# Patient Record
Sex: Female | Born: 1960 | Hispanic: Yes | Marital: Married | Smoking: Never smoker
Health system: Southern US, Community
[De-identification: ages and names within clinical notes are randomized; demographics above are authoritative.]

## PROBLEM LIST (undated history)

## (undated) HISTORY — PX: WISDOM TOOTH EXTRACTION: SHX21

---

## 2009-08-08 ENCOUNTER — Ambulatory Visit: Payer: Self-pay | Admitting: Family Medicine

## 2009-08-08 IMAGING — US SCREENING ULTRASOUND OF ABDOMINAL AORTA
1 series · 11 of 11 positions shown · non-contrast
Comparison: none

REASON FOR EXAM: family hx AAA
COMMENTS:

PROCEDURE:     US  - US EXAM AAA SCREENING  - [DATE]  [DATE]
RESULT:     Sonographic evaluation of the abdominal aorta demonstrates
maximum AP dimension of 1.97 cm with a transverse dimension of 2.01 cm.
There is no aneurysm or significant stenosis. The color Doppler appearance
is unremarkable.

[Series 1: screening ultrasound of abdominal aorta · 11 of 11 slices shown]
[im 1/11]
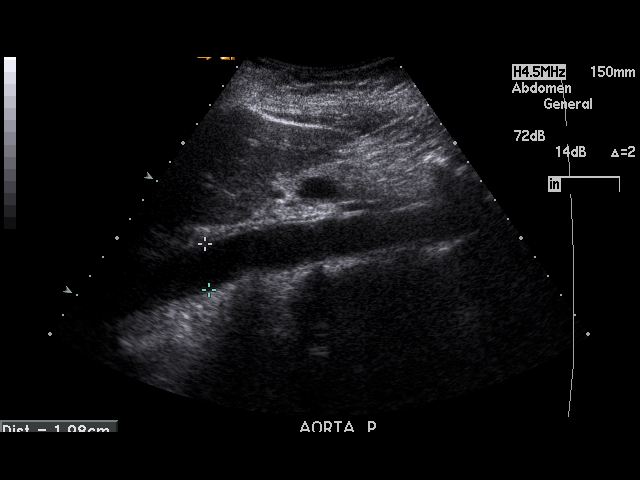
[im 2/11]
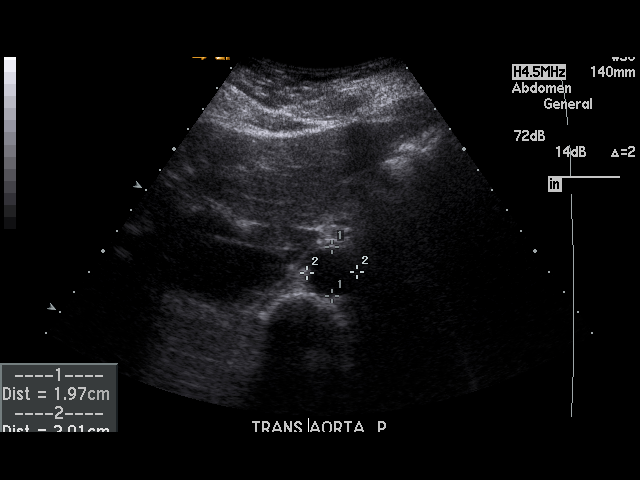
[im 3/11]
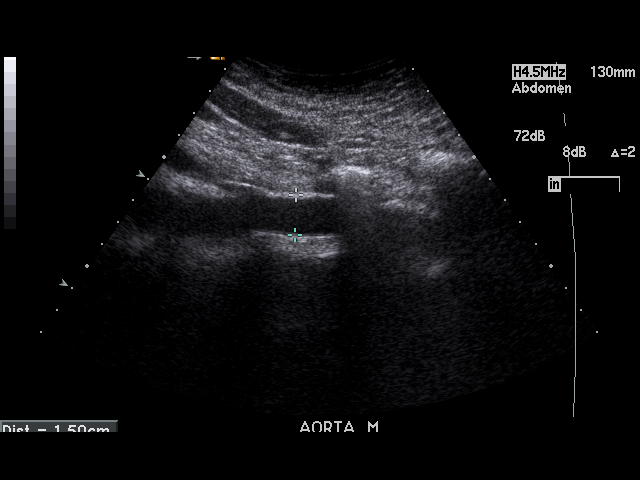
[im 4/11]
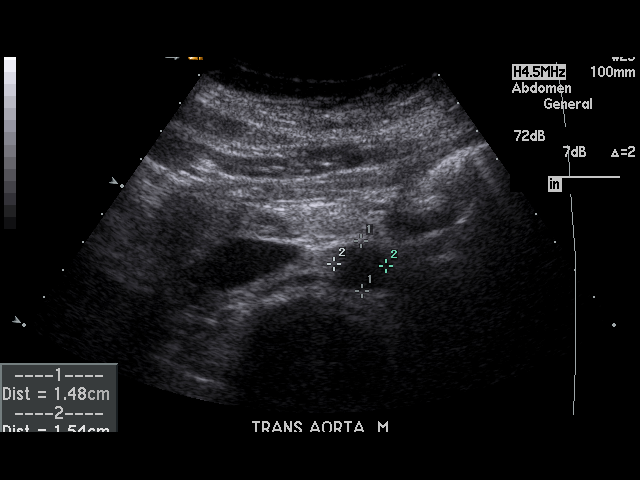
[im 5/11]
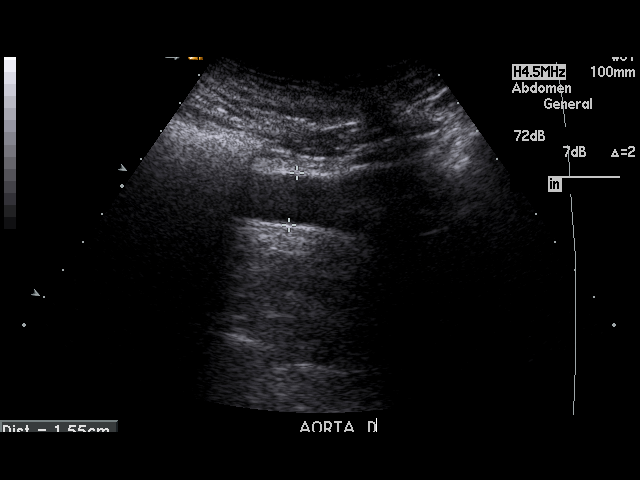
[im 6/11]
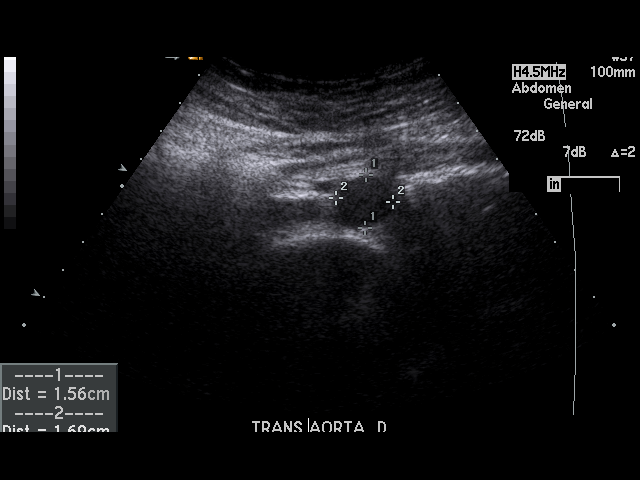
[im 7/11]
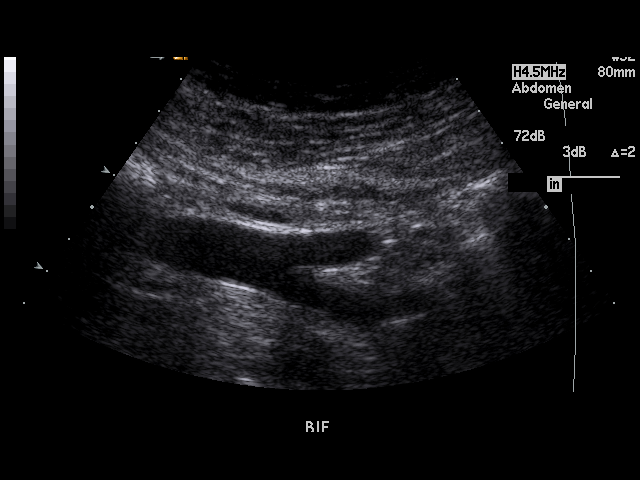
[im 8/11]
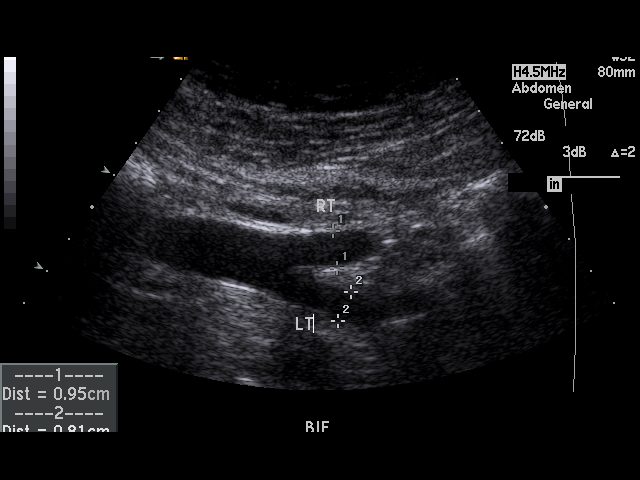
[im 9/11]
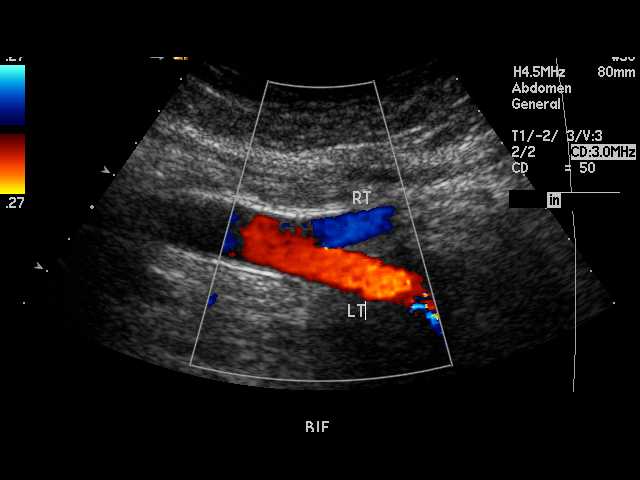
[im 10/11]
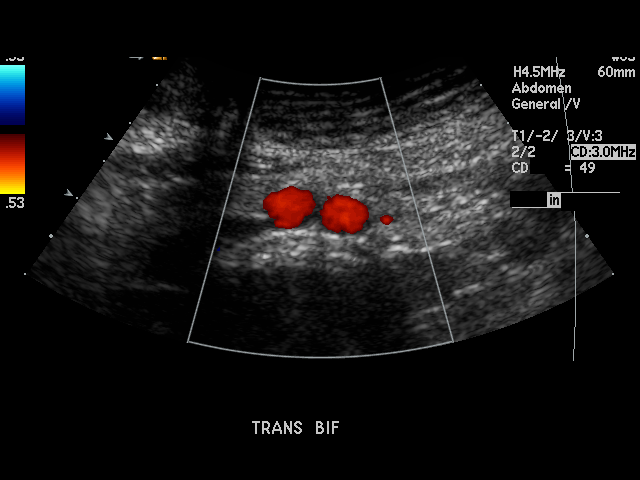
[im 11/11]
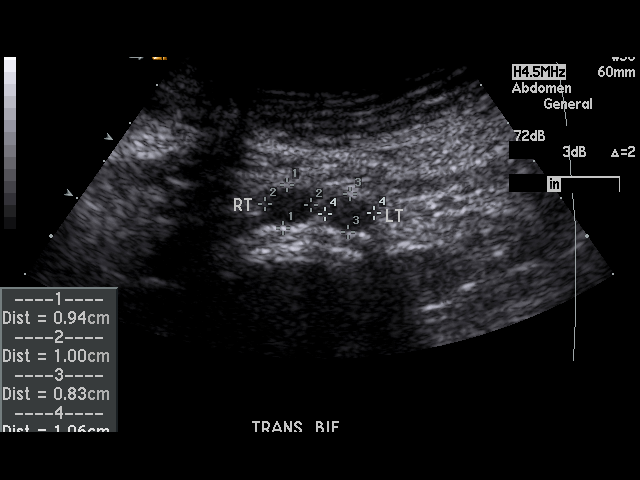

[11 of 11 positions shown; findings below may reference images not displayed]

IMPRESSION: Normal appearing aortic sonogram. The iliac show normal
caliber as well.

## 2014-12-10 ENCOUNTER — Ambulatory Visit
Admission: EM | Admit: 2014-12-10 | Discharge: 2014-12-10 | Disposition: A | Discharge status: Home / Self Care | Payer: BC Managed Care – PPO | Attending: Internal Medicine | Admitting: Internal Medicine

## 2014-12-10 ENCOUNTER — Encounter: Payer: Self-pay | Admitting: Emergency Medicine

## 2014-12-10 DIAGNOSIS — B085 Enteroviral vesicular pharyngitis: Secondary | ICD-10-CM | POA: Diagnosis not present

## 2014-12-10 DIAGNOSIS — B349 Viral infection, unspecified: Secondary | ICD-10-CM | POA: Diagnosis not present

## 2014-12-10 MED ORDER — PREDNISONE 50 MG PO TABS: 50.0000 mg | ORAL_TABLET | Freq: Every day | ORAL | Status: AC

## 2014-12-10 NOTE — Discharge Instructions (Signed)
Your mouth sores, sore throat, and ear ache seem most likely to be due to a virus, possibly herpangina. Symptoms usually improve on their own over several days. A prescription for prednisone was sent to the pharmacy, to try to help with ear pressure/congestion. Recheck for new fever >100.5, sore throat/ear ache not improving in 3-5 days.  Herpangina  Herpangina is a viral illness that causes sores inside the mouth and throat. It can be passed from person to person (contagious). Most cases of herpangina occur in the summer. CAUSES  Herpangina is caused by a virus. This virus can be spread by saliva and mouth-to-mouth contact. It can also be spread through contact with an infected person's stools. It usually takes 3 to 6 days after exposure to show signs of infection. SYMPTOMS   Fever.  Very sore, red throat.  Small blisters in the back of the throat.  Sores inside the mouth, lips, cheeks, and in the throat.  Blisters around the outside of the mouth.  Painful blisters on the palms of the hands and soles of the feet.  Irritability.  Poor appetite.  Dehydration. DIAGNOSIS  This diagnosis is made by a physical exam. Lab tests are usually not required. TREATMENT  This illness normally goes away on its own within 1 week. Medicines may be given to ease your symptoms. HOME CARE INSTRUCTIONS   Avoid salty, spicy, or acidic food and drinks. These foods may make your sores more painful.  If the patient is a baby or young child, weigh your child daily to check for dehydration. Rapid weight loss indicates there is not enough fluid intake. Consult your caregiver immediately.  Ask your caregiver for specific rehydration instructions.  Only take over-the-counter or prescription medicines for pain, discomfort, or fever as directed by your caregiver. SEEK IMMEDIATE MEDICAL CARE IF:   Your pain is not relieved with medicine.  You have signs of dehydration, such as dry lips and mouth,  dizziness, dark urine, confusion, or a rapid pulse. MAKE SURE YOU:  Understand these instructions.  Will watch your condition.  Will get help right away if you are not doing well or get worse. Document Released: 02/03/2003 Document Revised: 07/30/2011 Document Reviewed: 11/27/2010 [facility] Patient Information 2015 [place], [place]. This information is not intended to replace advice given to you by your health care provider. Make sure you discuss any questions you have with your health care provider.

## 2014-12-10 NOTE — ED Provider Notes (Addendum)
CSN: [id number]     Arrival date & time 12/10/14  1650 History   First MD Initiated Contact with Patient 12/10/14 1804     Chief Complaint  Patient presents with  . Otalgia  . URI   HPI   53yo lady with benign past medical history.  Runny/congested nose for about the last 10 days, little bit of throat-cleary type cough.Post nasal drainage.  No fever.  Sore throat increasing in last couple days, with ear ache, sores under tongue. No vomiting, no diarrhea.   History reviewed. No pertinent past medical history. Past Surgical History  Procedure Laterality Date  . Cesarean section    . Wisdom tooth extraction     Family History  Problem Relation Age of Onset  . Aneurysm Father   . Hypertension Father    History  Substance Use Topics  . Smoking status: Never Smoker   . Smokeless tobacco: Never Used  . Alcohol Use: Yes     Comment: occasional    Review of Systems  All other systems reviewed and are negative.   Allergies  Review of patient's allergies indicates no known allergies.  Home Medications  Takes Claritin and Flonase regularly  BP 136/56 mmHg  Pulse 67  Temp(Src) 98.2 F (36.8 C) (Tympanic)  Resp 16  Ht  (1.6 m)  Wt 163 lb (73.936 kg)  BMI 28.88 kg/m2  SpO2 100% Physical Exam  Constitutional: She is oriented to person, place, and time. No distress.  Alert, nicely groomed Voice sounds a little congested  HENT:  Head: Atraumatic.  R TM translucent, but flushed deep pink. L TM mod dull, no erythema. Mod to marked nasal congestion. Throat red with post nasal drainage, small erythematous patches under tongue, esp to L side.  Eyes:  Conjugate gaze, no eye redness/drainage  Neck: Neck supple.  Cardiovascular: Normal rate and regular rhythm.   Pulmonary/Chest: No respiratory distress. She has no wheezes. She has no rales.  Lungs clear, symmetric breath sounds  Abdominal: She exhibits no distension.  Musculoskeletal: Normal range of motion.  No leg  swelling  Neurological: She is alert and oriented to person, place, and time.  Skin: Skin is warm and dry. No rash noted.  No cyanosis  Nursing note and vitals reviewed.   ED Course  Procedures   None at urgent care today.  MDM   1. Viral syndrome   2. Herpangina , possible  Rx couple doses prednisone, for decongestion.   Recheck or FU pcp/Bess White NP for persistent/worsening sx's.    Eustace Moore, MD 12/10/14 2226  Eustace Moore, MD 12/11/14 Aretha Parrot  Eustace Moore, MD 12/11/14 [phone]

## 2014-12-10 NOTE — ED Notes (Signed)
Cold symptoms, ear pain for 1 week. Has taken Claritin and Flonase that was recommended from Primary doctor

## 2015-05-26 ENCOUNTER — Ambulatory Visit
Admission: EM | Admit: 2015-05-26 | Discharge: 2015-05-26 | Disposition: A | Discharge status: Home / Self Care | Payer: BC Managed Care – PPO | Attending: Family Medicine | Admitting: Family Medicine

## 2015-05-26 DIAGNOSIS — J069 Acute upper respiratory infection, unspecified: Secondary | ICD-10-CM

## 2015-05-26 MED ORDER — BENZONATATE 200 MG PO CAPS: 200.0000 mg | ORAL_CAPSULE | Freq: Three times a day (TID) | ORAL | Status: AC | PRN

## 2015-05-26 MED ORDER — HYDROCOD POLST-CPM POLST ER 10-8 MG/5ML PO SUER: 5.0000 mL | Freq: Two times a day (BID) | ORAL | Status: AC

## 2015-05-26 NOTE — ED Notes (Signed)
Non-toxic appearing pt.   Has cold like symptoms.  Dry cough, which pt reports is "sometimes productive".

## 2015-05-26 NOTE — ED Notes (Signed)
Reports chills, headache, dry cough.   Ongoing since yesterday.  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

## 2015-05-26 NOTE — Discharge Instructions (Signed)
Vaporizadores de Soil scientistaire fro Clinical research associate(Cool Mist Vaporizers) Los vaporizadores ayudan a Paramedicaliviar los sntomas de la tos y Metallurgistel resfro. Agregan humedad al aire, lo que fluidifica el moco y lo hace menos espeso. Facilitan la respiracin y favorecen la eliminacin de secreciones. Los vaporizadores de aire fro no provocan quemaduras serias Lubrizol Corporationcomo los de aire caliente, que tambin se llaman humidificadores. No se ha probado que los vaporizadores mejoren el resfro. No debe usar un vaporizador si es Pharmacologistalrgico al moho. INSTRUCCIONES PARA EL CUIDADO EN EL HOGAR  Siga las instrucciones para el uso del vaporizador que se encuentran en la caja.  Use solamente agua destilada en el vaporizador.  No use el vaporizador en forma continua. Esto puede formar moho o hacer que se desarrollen bacterias en el vaporizador.  Limpie el vaporizador cada vez que se use.  Lmpielo y squelo bien antes de guardarlo.  Deje de usarlo si los sntomas respiratorios empeoran.   Esta informacin no tiene Theme park managercomo fin reemplazar el consejo del mdico. Asegrese de hacerle al mdico cualquier pregunta que tenga.   Document Released: 01/07/2013 Document Revised: 05/12/2013 Elsevier Interactive Patient Education 2016 [employer]  Infeccin del tracto respiratorio superior, adultos (Upper Respiratory Infection, Adult) La mayora de las infecciones del tracto respiratorio superior son infecciones virales de las vas que llevan el aire a los pulmones. Un infeccin del tracto respiratorio superior afecta la nariz, la garganta y las vas respiratorias superiores. El tipo ms frecuente de infeccin del tracto respiratorio superior es la nasofaringitis, que habitualmente se conoce como "resfro comn". Las infecciones del tracto respiratorio superior siguen su curso y por lo general se curan solas. En la International Business Machinesmayora de los casos, la infeccin del tracto respiratorio superior no requiere atencin Seffnermdica, Biomedical engineerpero a veces, despus de una infeccin viral, puede  surgir una infeccin bacteriana en las vas respiratorias superiores. Esto se conoce como infeccin secundaria. Las infecciones sinusales y en el odo medio son tipos frecuentes de infecciones secundarias en el tracto respiratorio superior. La neumona bacteriana tambin puede complicar un cuadro de infeccin del tracto respiratorio superior. Este tipo de infeccin puede empeorar el asma y la enfermedad pulmonar obstructiva crnica (EPOC). En algunos casos, estas complicaciones pueden requerir atencin mdica de emergencia y poner en peligro la vida.  CAUSAS Casi todas las infecciones del tracto respiratorio superior se deben a los virus. Un virus es un tipo de microbio que puede contagiarse de Neomia Dearuna persona a Educational psychologistotra.  FACTORES DE RIESGO Puede estar en riesgo de sufrir una infeccin del tracto respiratorio superior si:   Fuma.  Tiene una enfermedad pulmonar o cardaca crnica.  Tiene debilitado el sistema de defensa (inmunitario) del cuerpo.  Es [address] Entrancemuy joven o de edad muy Camp Croftavanzada.  Tiene asma o alergias nasales.  Trabaja en reas donde hay mucha gente o poca ventilacin.  Rudi Cocorabaja en una escuela o en un centro de atencin mdica. SIGNOS Y SNTOMAS  Habitualmente, los sntomas aparecen de 2a 3das despus de entrar en contacto con el virus del resfro. La mayora de las infecciones virales en el tracto respiratorio superior duran de 7a 10das. Sin embargo, las infecciones virales en el tracto respiratorio superior a causa del virus de la gripe pueden durar de 14a 18das y, habitualmente, son ms graves. Entre los sntomas se pueden incluir los siguientes:   Secrecin o congestin nasal.  Estornudos.  Tos.  Dolor de Advertising copywritergarganta.  Dolor de Turkmenistancabeza.  Fatiga.  Grant RutsFiebre.  Prdida del apetito.  Dolor en la frente, detrs de los ojos y  por encima de los pmulos (dolor sinusal).  Dolores musculares. DIAGNSTICO  El mdico puede diagnosticar una infeccin del tracto respiratorio superior  mediante los siguientes estudios:  Examen fsico.  Pruebas para verificar si los sntomas no se deben a otra afeccin, por ejemplo:  Faringitis estreptoccica.  Sinusitis.  Neumona.  Asma. TRATAMIENTO  Esta infeccin desaparece sola, con el tiempo. No puede curarse con medicamentos, pero a menudo se prescriben para aliviar los sntomas. Los medicamentos pueden ser tiles para lo siguiente:  Personal assistantBajar la fiebre.  Reducir la tos.  Aliviar la congestin nasal. INSTRUCCIONES PARA EL CUIDADO EN EL HOGAR   Tome los medicamentos solamente como se lo haya indicado el mdico.  A fin de Engineer, materialsaliviar el dolor de garganta, haga grgaras con solucin salina templada o consuma caramelos para la tos, como se lo haya indicado el mdico.  Use un humidificador de vapor clido o inhale el vapor de la ducha para aumentar la humedad del aire. Esto facilitar la respiracin.  Beba suficiente lquido para Photographermantener la orina clara o de color amarillo plido.  Consuma sopas y otros caldos transparentes, y Abbott Laboratoriesalimntese bien.  Descanse todo lo que sea necesario.  Regrese al Aleen Campitrabajo cuando la temperatura se le haya normalizado o cuando el mdico lo autorice. Es posible que deba quedarse en su casa durante un tiempo prolongado, para no infectar a los dems. Tambin puede usar un barbijo y lavarse las manos con cuidado para Transport plannerevitar la propagacin del virus.  Aumente el uso del inhalador si tiene asma.  No consuma ningn producto que contenga tabaco, lo que incluye cigarrillos, tabaco de Theatre managermascar o Administrator, Civil Servicecigarrillos electrnicos. Si necesita ayuda para dejar de fumar, consulte al American Expressmdico. PREVENCIN  La mejor manera de protegerse de un resfro es mantener una higiene Conneradecuada.   Evite el contacto oral o fsico con personas que tengan sntomas de resfro.  En caso de contacto, lvese las manos con frecuencia. No hay pruebas claras de que la vitaminaC, la vitaminaE, la equincea o el ejercicio reduzcan la probabilidad de  Primary school teachercontraer un resfro. Sin embargo, siempre se recomienda Insurance account managerdescansar mucho, hacer ejercicio y Engineering geologistalimentarse bien.  SOLICITE ATENCIN MDICA SI:   Su estado empeora en lugar de mejorar.  Los medicamentos no Estate agentlogran controlar los sntomas.  Tiene escalofros.  La sensacin de falta de aire empeora.  Tiene mucosidad marrn o roja.  Tiene secrecin nasal amarilla o marrn.  Le duele la cara, especialmente al inclinarse hacia adelante.  Tiene fiebre.  Tiene los ganglios del cuello hinchados.  Siente dolor al tragar.  Tiene zonas blancas en la parte de atrs de la garganta. SOLICITE ATENCIN MDICA DE INMEDIATO SI:   Tiene sntomas intensos o persistentes de:  Dolor de Turkmenistancabeza.  Dolor de odos.  Dolor sinusal.  Dolor en el pecho.  Tiene enfermedad pulmonar crnica y cualquiera de estos sntomas:  Sibilancias.  Tos prolongada.  Tos con sangre.  Cambio en la mucosidad habitual.  Presenta rigidez en el cuello.  Tiene cambios en:  La visin.  La audicin.  El pensamiento.  El Edgemontestado de nimo. ASEGRESE DE QUE:   Comprende estas instrucciones.  Controlar su afeccin.  Recibir ayuda de inmediato si no mejora o si empeora.   Esta informacin no tiene Theme park managercomo fin reemplazar el consejo del mdico. Asegrese de hacerle al mdico cualquier pregunta que tenga.   Document Released: 02/14/2005 Document Revised: 09/21/2014 Elsevier Interactive Patient Education [employer]2016 Elsevier Inc.

## 2015-05-26 NOTE — ED Provider Notes (Signed)
CSN: [id number]     Arrival date & time 05/26/15  1128 History   First MD Initiated Contact with Patient 05/26/15 1354     Chief Complaint  Patient presents with  . URI  . Cough   (Consider location/radiation/quality/duration/timing/severity/associated sxs/prior Treatment) HPI  55 year old female who presents with chills headache and a dry cough yesterday. As a return trip from [place] on a crowded aircraft cough the whole way the flight. He also has been around other sick coworkers. She has a low-grade fever 100.2 pulse 71 respirations 16 blood pressure 137/72 and an O2 sat on room air 100%. Her cough is mostly dry and nonproductive. He has had some body aches. Does not look sick or toxic.  History reviewed. No pertinent past medical history. Past Surgical History  Procedure Laterality Date  . Cesarean section    . Wisdom tooth extraction     Family History  Problem Relation Age of Onset  . Aneurysm Father   . Hypertension Father    Social History  Substance Use Topics  . Smoking status: Never Smoker   . Smokeless tobacco: Never Used  . Alcohol Use: Yes     Comment: occasional   OB History    No data available     Review of Systems  Constitutional: Positive for fever, chills and fatigue.  HENT: Positive for congestion, postnasal drip, rhinorrhea, sinus pressure, sneezing and sore throat.   Respiratory: Positive for cough. Negative for wheezing.   All other systems reviewed and are negative.   Allergies  Review of patient's allergies indicates no known allergies.  Home Medications   Prior to Admission medications   Medication Sig Start Date End Date Taking? Authorizing Provider  benzonatate (TESSALON) 200 MG capsule Take 1 capsule (200 mg total) by mouth 3 (three) times daily as needed for cough. 05/26/15   Lutricia Feil, PA-C  chlorpheniramine-HYDROcodone (TUSSIONEX PENNKINETIC ER) 10-8 MG/5ML SUER Take 5 mLs by mouth 2 (two) times daily. 05/26/15   Lutricia Feil, PA-C  fluticasone (FLONASE) 50 MCG/ACT nasal spray Place into both nostrils daily.    Historical Provider, MD  loratadine (CLARITIN) 10 MG tablet Take 10 mg by mouth daily.    Historical Provider, MD  predniSONE (DELTASONE) 50 MG tablet Take 1 tablet (50 mg total) by mouth daily. 12/10/14   Eustace Moore, MD   Meds Ordered and Administered this Visit  Medications - No data to display  BP 137/72 mmHg  Pulse 71  Temp(Src) 100.2 F (37.9 C) (Tympanic)  Resp 16  SpO2 100% No data found.   Physical Exam  Constitutional: She is oriented to person, place, and time. She appears well-developed and well-nourished. No distress.  HENT:  Head: Normocephalic and atraumatic.  Right Ear: External ear normal.  Left Ear: External ear normal.  Nose: Nose normal.  Mouth/Throat: Oropharynx is clear and moist. No oropharyngeal exudate.  Eyes: Conjunctivae are normal. Pupils are equal, round, and reactive to light. Right eye exhibits no discharge. Left eye exhibits no discharge.  Neck: Normal range of motion. Neck supple.  Pulmonary/Chest: Effort normal and breath sounds normal. No respiratory distress. She has no wheezes. She has no rales.  Musculoskeletal: Normal range of motion. She exhibits no edema or tenderness.  Lymphadenopathy:    She has no cervical adenopathy.  Neurological: She is alert and oriented to person, place, and time.  Skin: Skin is warm and dry. She is not diaphoretic.  Psychiatric: She has a normal mood  and affect. Her behavior is normal. Judgment and thought content normal.  Nursing note and vitals reviewed.   ED Course  Procedures (including critical care time)  Labs Review Labs Reviewed - No data to display  Imaging Review No results found.   Visual Acuity Review  Right Eye Distance:   Left Eye Distance:   Bilateral Distance:    Right Eye Near:   Left Eye Near:    Bilateral Near:         MDM   1. Acute URI    Discharge Medication List as of  05/26/2015  2:16 PM    START taking these medications   Details  benzonatate (TESSALON) 200 MG capsule Take 1 capsule (200 mg total) by mouth 3 (three) times daily as needed for cough., Starting 05/26/2015, Until Discontinued, Print    chlorpheniramine-HYDROcodone (TUSSIONEX PENNKINETIC ER) 10-8 MG/5ML SUER Take 5 mLs by mouth 2 (two) times daily., Starting 05/26/2015, Until Discontinued, Print      Plan: 1. Diagnosis reviewed with patient 2. rx as per orders; risks, benefits, potential side effects reviewed with patient 3. Recommend supportive treatment with fluids and rest as much as possible. We've her medications for her cough. She should take Tylenol or ibuprofen for her body aches. She'll continue to use the Flonase nasal spray that she has at home. She should follow-up with her primary if she has no improvement or is worsening. 4. F/u prn if symptoms worsen or don't improve     Lutricia FeilWilliam P Roemer, PA-C 05/26/15 2048

## 2020-10-07 ENCOUNTER — Other Ambulatory Visit: Payer: Self-pay | Admitting: Physician Assistant

## 2020-10-07 DIAGNOSIS — M8580 Other specified disorders of bone density and structure, unspecified site: Secondary | ICD-10-CM

## 2020-11-07 ENCOUNTER — Ambulatory Visit
Admission: RE | Admit: 2020-11-07 | Discharge: 2020-11-07 | Disposition: A | Discharge status: Home / Self Care | Payer: BC Managed Care – PPO | Source: Ambulatory Visit | Attending: Physician Assistant | Admitting: Physician Assistant

## 2020-11-07 ENCOUNTER — Other Ambulatory Visit: Payer: Self-pay

## 2020-11-07 DIAGNOSIS — M8580 Other specified disorders of bone density and structure, unspecified site: Secondary | ICD-10-CM | POA: Diagnosis not present

## 2021-07-28 ENCOUNTER — Other Ambulatory Visit: Payer: Self-pay | Admitting: Endocrinology

## 2021-07-28 DIAGNOSIS — E213 Hyperparathyroidism, unspecified: Secondary | ICD-10-CM

## 2021-09-18 ENCOUNTER — Ambulatory Visit
Admission: RE | Admit: 2021-09-18 | Discharge: 2021-09-18 | Disposition: A | Discharge status: Home / Self Care | Payer: BC Managed Care – PPO | Source: Ambulatory Visit | Attending: Endocrinology | Admitting: Endocrinology

## 2021-09-18 DIAGNOSIS — E213 Hyperparathyroidism, unspecified: Secondary | ICD-10-CM | POA: Diagnosis not present

## 2021-09-19 ENCOUNTER — Other Ambulatory Visit: Payer: Self-pay | Admitting: Endocrinology

## 2021-09-19 DIAGNOSIS — E213 Hyperparathyroidism, unspecified: Secondary | ICD-10-CM

## 2023-05-20 ENCOUNTER — Other Ambulatory Visit: Payer: Self-pay

## 2023-05-20 DIAGNOSIS — Z1231 Encounter for screening mammogram for malignant neoplasm of breast: Secondary | ICD-10-CM

## 2023-10-29 ENCOUNTER — Ambulatory Visit
Admission: RE | Admit: 2023-10-29 | Discharge: 2023-10-29 | Disposition: A | Discharge status: Home / Self Care | Payer: Self-pay | Source: Ambulatory Visit

## 2023-10-29 DIAGNOSIS — Z1231 Encounter for screening mammogram for malignant neoplasm of breast: Secondary | ICD-10-CM | POA: Insufficient documentation

## 2023-10-31 ENCOUNTER — Inpatient Hospital Stay
Admission: RE | Admit: 2023-10-31 | Discharge: 2023-10-31 | Disposition: A | Discharge status: Home / Self Care | Payer: Self-pay | Source: Ambulatory Visit

## 2023-10-31 ENCOUNTER — Other Ambulatory Visit: Payer: Self-pay | Admitting: *Deleted

## 2023-10-31 DIAGNOSIS — Z1231 Encounter for screening mammogram for malignant neoplasm of breast: Secondary | ICD-10-CM

## 2024-03-04 ENCOUNTER — Other Ambulatory Visit: Payer: Self-pay | Admitting: Endocrinology

## 2024-03-04 DIAGNOSIS — M810 Age-related osteoporosis without current pathological fracture: Secondary | ICD-10-CM

## 2024-04-27 ENCOUNTER — Ambulatory Visit
Admission: RE | Admit: 2024-04-27 | Discharge: 2024-04-27 | Disposition: A | Source: Ambulatory Visit | Attending: Endocrinology | Admitting: Endocrinology

## 2024-04-27 DIAGNOSIS — M810 Age-related osteoporosis without current pathological fracture: Secondary | ICD-10-CM
